# Patient Record
Sex: Male | Born: 1985 | Race: Black or African American | Hispanic: No | Marital: Married | Smoking: Never smoker
Health system: Southern US, Community
[De-identification: ages and names within clinical notes are randomized; demographics above are authoritative.]

---

## 2018-06-19 ENCOUNTER — Encounter (HOSPITAL_COMMUNITY): Payer: Self-pay | Admitting: *Deleted

## 2018-06-19 ENCOUNTER — Ambulatory Visit (HOSPITAL_COMMUNITY)
Admission: EM | Admit: 2018-06-19 | Discharge: 2018-06-19 | Disposition: A | Discharge status: Home / Self Care | Payer: Medicaid Other | Attending: Internal Medicine | Admitting: Internal Medicine

## 2018-06-19 ENCOUNTER — Other Ambulatory Visit: Payer: Self-pay

## 2018-06-19 DIAGNOSIS — K5289 Other specified noninfective gastroenteritis and colitis: Secondary | ICD-10-CM

## 2018-06-19 DIAGNOSIS — K529 Noninfective gastroenteritis and colitis, unspecified: Secondary | ICD-10-CM | POA: Insufficient documentation

## 2018-06-19 DIAGNOSIS — R197 Diarrhea, unspecified: Secondary | ICD-10-CM | POA: Diagnosis present

## 2018-06-19 DIAGNOSIS — R109 Unspecified abdominal pain: Secondary | ICD-10-CM | POA: Diagnosis present

## 2018-06-19 MED ORDER — BISMUTH SUBSALICYLATE 262 MG/15ML PO SUSP: 30.0000 mL | Freq: Four times a day (QID) | ORAL | 0 refills | Status: DC | PRN

## 2018-06-19 NOTE — Discharge Instructions (Signed)
Please use pepto bismol as needed  I expect bowel movements to continue to slowly improve and consistency and frequency  Please drink plenty of fluids, may drink Pedialyte or Gatorade to help replace electrolytes  Please follow-up if symptoms continue to not improve, worsen, abdominal pain worsens, develops nausea or vomiting

## 2018-06-19 NOTE — ED Provider Notes (Signed)
[facility]    CSN: [id number] Arrival date & time: 06/19/18  1050     History   Chief Complaint No chief complaint on file.   HPI Phillip Morgan is a 32 y.o. male no significant past medical history presenting today for evaluation of abdominal pain and diarrhea. Symptoms began approximately 1 week ago and have persisted. He recently traveled from [place] to the [place] yesterday. Abdominal pain is described as cramping.  Cramping comes and goes, worse right before needing to go to a bowel movement.  Pain slightly improves after having a bowel movement.  States that symptoms have improved since he was in [place] and symptoms initially began.  He has noticed a little bit of blood in the stool and pain near his rectum.  Denies associated nausea or vomiting.  Tolerating oral intake like normal.  Has felt slightly lightheaded.  HPI  History reviewed. No pertinent past medical history.  There are no active problems to display for this patient.   History reviewed. No pertinent surgical history.     Home Medications    Prior to Admission medications   Medication Sig Start Date End Date Taking? Authorizing Provider  bismuth subsalicylate (PEPTO BISMOL) 262 MG/15ML suspension Take 30 mLs by mouth every 6 (six) hours as needed for diarrhea or loose stools. 06/19/18   Josilyn Shippee, Junius Creamer, PA-C    Family History Family History  Problem Relation Age of Onset  . Healthy Mother     Social History Social History   Tobacco Use  . Smoking status: Never Smoker  . Smokeless tobacco: Never Used  Substance Use Topics  . Alcohol use: Never    Frequency: Never  . Drug use: Never     Allergies   Patient has no known allergies.   Review of Systems Review of Systems  Constitutional: Negative for activity change, appetite change, chills, fatigue and fever.  HENT: Negative for congestion, ear pain, rhinorrhea, sinus pressure, sore throat and trouble swallowing.   Eyes:  Negative for discharge and redness.  Respiratory: Negative for cough, chest tightness and shortness of breath.   Cardiovascular: Negative for chest pain.  Gastrointestinal: Positive for abdominal pain, blood in stool and diarrhea. Negative for nausea and vomiting.  Musculoskeletal: Negative for myalgias.  Skin: Negative for rash.  Neurological: Negative for dizziness, light-headedness and headaches.     Physical Exam Triage Vital Signs ED Triage Vitals  Enc Vitals Group     BP 06/19/18 1109 (!) 104/50     Pulse Rate 06/19/18 1109 (!) 57     Resp 06/19/18 1109 16     Temp 06/19/18 1109 98.4 F (36.9 C)     Temp Source 06/19/18 1109 Oral     SpO2 06/19/18 1109 100 %     Weight --      Height --      Head Circumference --      Peak Flow --      Pain Score 06/19/18 1115 0     Pain Loc --      Pain Edu? --      Excl. in GC? --    No data found.  Updated Vital Signs BP (!) 104/50   Pulse (!) 57   Temp 98.4 F (36.9 C) (Oral)   Resp 16   SpO2 100%   Visual Acuity Right Eye Distance:   Left Eye Distance:   Bilateral Distance:    Right Eye Near:   Left Eye Near:  Bilateral Near:     Physical Exam  Constitutional: He appears well-developed and well-nourished.  HENT:  Head: Normocephalic and atraumatic.  Mouth/Throat: Oropharynx is clear and moist.  Eyes: Pupils are equal, round, and reactive to light. Conjunctivae and EOM are normal.  Neck: Neck supple.  Cardiovascular: Normal rate and regular rhythm.  No murmur heard. Pulmonary/Chest: Effort normal and breath sounds normal. No respiratory distress.  Abdominal: Soft. There is tenderness.  Mild tenderness to palpation of right and left lower quadrants, negative rebound, negative McBurney's, negative Rovsing.  Musculoskeletal: He exhibits no edema.  Neurological: He is alert.  Skin: Skin is warm and dry.  Psychiatric: He has a normal mood and affect.  Nursing note and vitals reviewed.    UC Treatments /  Results  Labs (all labs ordered are listed, but only abnormal results are displayed) Labs Reviewed  GASTROINTESTINAL PANEL BY PCR, STOOL (REPLACES STOOL CULTURE)    EKG None  Radiology No results found.  Procedures Procedures (including critical care time)  Medications Ordered in UC Medications - No data to display  Initial Impression / Assessment and Plan / UC Course  I have reviewed the triage vital signs and the nursing notes.  Pertinent labs & imaging results that were available during my care of the patient were reviewed by me and considered in my medical decision making (see chart for details).     Stool sample attempted to obtain in clinic, insufficient amount.  Will send home with supplies to return stool sample for further GI pathogen testing.  Symptoms seem to be improving, tolerating oral intake.  Discussed importance of hydration.  Provided Pepto-Bismol to use as needed.Discussed strict return precautions. Patient verbalized understanding and is agreeable with plan.  Final Clinical Impressions(s) / UC Diagnoses   Final diagnoses:  Gastroenteritis     Discharge Instructions     Please use pepto bismol as needed  I expect bowel movements to continue to slowly improve and consistency and frequency  Please drink plenty of fluids, may drink Pedialyte or Gatorade to help replace electrolytes  Please follow-up if symptoms continue to not improve, worsen, abdominal pain worsens, develops nausea or vomiting    ED Prescriptions    Medication Sig Dispense Auth. Provider   bismuth subsalicylate (PEPTO BISMOL) 262 MG/15ML suspension Take 30 mLs by mouth every 6 (six) hours as needed for diarrhea or loose stools. 360 mL Raymonda Pell C, PA-C     Controlled Substance Prescriptions  Controlled Substance Registry consulted? Not Applicable   Lew DawesWieters, Shenay Torti C, [place]PA-C 06/19/18 1240

## 2018-06-19 NOTE — ED Triage Notes (Signed)
Per video interpreter [phone]#[id number]: C/O abd pain with diarrhea x 7 days, while pt was in [place].  States was treated with med (unk type of med) in [place] and was having improvement, but when coming to [place], cramping pain started again.  Reports "a little bit of slight blood" in stool; states blood bright red.

## 2018-06-21 LAB — GASTROINTESTINAL PANEL BY PCR, STOOL (REPLACES STOOL CULTURE)
ADENOVIRUS F40/41: NOT DETECTED
Astrovirus: NOT DETECTED
CYCLOSPORA CAYETANENSIS: NOT DETECTED
Campylobacter species: NOT DETECTED
Cryptosporidium: NOT DETECTED
ENTAMOEBA HISTOLYTICA: NOT DETECTED
ENTEROAGGREGATIVE E COLI (EAEC): DETECTED — AB
ENTEROPATHOGENIC E COLI (EPEC): NOT DETECTED
Enterotoxigenic E coli (ETEC): NOT DETECTED
GIARDIA LAMBLIA: NOT DETECTED
Norovirus GI/GII: NOT DETECTED
Plesimonas shigelloides: NOT DETECTED
ROTAVIRUS A: NOT DETECTED
Salmonella species: NOT DETECTED
Sapovirus (I, II, IV, and V): DETECTED — AB
Shiga like toxin producing E coli (STEC): NOT DETECTED
Shigella/Enteroinvasive E coli (EIEC): DETECTED — AB
VIBRIO CHOLERAE: NOT DETECTED
VIBRIO SPECIES: NOT DETECTED
YERSINIA ENTEROCOLITICA: NOT DETECTED

## 2018-06-22 ENCOUNTER — Telehealth (HOSPITAL_COMMUNITY): Payer: Self-pay

## 2018-06-22 NOTE — Telephone Encounter (Signed)
Per Dr. Tracie HarrierHagler, if patient is asymptomatic no treatment is needed. Per patient he is feeling better with no diarrhea or vomiting. Encouraged to follow up with his PCP. Verbalized understanding.

## 2018-10-08 ENCOUNTER — Ambulatory Visit: Payer: Self-pay | Admitting: Family Medicine

## 2018-10-14 ENCOUNTER — Encounter: Payer: Self-pay | Admitting: Family Medicine

## 2018-10-14 ENCOUNTER — Ambulatory Visit (INDEPENDENT_AMBULATORY_CARE_PROVIDER_SITE_OTHER): Payer: Medicaid Other | Admitting: Family Medicine

## 2018-10-14 VITALS — BP 118/71 | HR 58 | Temp 98.0°F | Resp 17 | Ht 65.0 in | Wt 138.2 lb

## 2018-10-14 DIAGNOSIS — Z1329 Encounter for screening for other suspected endocrine disorder: Secondary | ICD-10-CM | POA: Diagnosis not present

## 2018-10-14 DIAGNOSIS — Z7689 Persons encountering health services in other specified circumstances: Secondary | ICD-10-CM

## 2018-10-14 DIAGNOSIS — Z131 Encounter for screening for diabetes mellitus: Secondary | ICD-10-CM

## 2018-10-14 DIAGNOSIS — Z23 Encounter for immunization: Secondary | ICD-10-CM

## 2018-10-14 DIAGNOSIS — Z13220 Encounter for screening for lipoid disorders: Secondary | ICD-10-CM

## 2018-10-14 DIAGNOSIS — Z130 Encounter for screening for diseases of the blood and blood-forming organs and certain disorders involving the immune mechanism: Secondary | ICD-10-CM | POA: Diagnosis not present

## 2018-10-14 NOTE — Progress Notes (Signed)
Phillip Morgan, is a 32 y.o. male  HKV:[id number]  FIE:[id number]  DOB - [birth year]  CC: No chief complaint on file.      HPI: [name] is a 32 y.o. male is here today to establish care.   [name] does not have a problem list on file.    Today's visit:  Phillip Morgan present today to establish care with a new provider. He has not prior medical history . He is a recent immigrant from [place]. Reports adjusting well. He works at [employer]. He has not issues or concerns. Received  Patient denies new headaches, chest pain, abdominal pain, nausea, new weakness , numbness or tingling, SOB, edema, or worrisome cough. .   Current medications:No current outpatient medications on file.   Pertinent family medical history: family history includes Healthy in his mother.   No Known Allergies  Social History   Socioeconomic History  . Marital status: Married    Spouse name: Not on file  . Number of children: Not on file  . Years of education: Not on file  . Highest education level: Not on file  Occupational History  . Not on file  Social Needs  . Financial resource strain: Not on file  . Food insecurity:    Worry: Not on file    Inability: Not on file  . Transportation needs:    Medical: Not on file    Non-medical: Not on file  Tobacco Use  . Smoking status: Never Smoker  . Smokeless tobacco: Never Used  Substance and Sexual Activity  . Alcohol use: Never    Frequency: Never  . Drug use: Never  . Sexual activity: Not on file  Lifestyle  . Physical activity:    Days per week: Not on file    Minutes per session: Not on file  . Stress: Not on file  Relationships  . Social connections:    Talks on phone: Not on file    Gets together: Not on file    Attends religious service: Not on file    Active member of club or organization: Not on file    Attends meetings of clubs or organizations: Not on file    Relationship status: Not on file  . Intimate partner  violence:    Fear of current or ex partner: Not on file    Emotionally abused: Not on file    Physically abused: Not on file    Forced sexual activity: Not on file  Other Topics Concern  . Not on file  Social History Narrative  . Not on file    Review of Systems: Constitutional: Negative for fever, chills, diaphoresis, activity change, appetite change and fatigue. Eyes: Negative for pain, discharge, redness, itching and visual disturbance. Respiratory: Negative for cough, choking, chest tightness, shortness of breath, wheezing and stridor.  Cardiovascular: Negative for chest pain, palpitations and leg swelling. Gastrointestinal: Negative for abdominal distention, tenderness, or mass Musculoskeletal: Negative for back pain, joint swelling, arthralgia and gait problem. Neurological: Negative for dizziness, tremors, seizures, syncope, facial asymmetry, speech difficulty, weakness, light-headedness, numbness and headaches.  Hematological: Negative for adenopathy. Does not bruise/bleed easily. Psychiatric/Behavioral: Negative for hallucinations, behavioral problems, confusion, dysphoric mood, decreased concentration and agitation.  Objective:   Vitals:   10/14/18 1107  BP: 118/71  Pulse: (!) 58  Resp: 17  Temp: 98 F (36.7 C)  SpO2: 98%    BP Readings from Last 3 Encounters:  06/19/18 (!) 104/50    [employer]  10/14/18 1107  Weight: 138 lb 3.2 oz (62.7 kg)      Physical Exam: Constitutional: Patient appears well-developed and well-nourished. No distress. HENT: Normocephalic, atraumatic, External right and left ear normal. Oropharynx is clear and moist.  Eyes: Conjunctivae and EOM are normal. PERRLA, no scleral icterus. Neck: Normal ROM. Neck supple. No JVD. No tracheal deviation. No thyromegaly. CVS: RR, Bradycardia, S1/S2 +, no murmurs, no gallops, no carotid bruit.  Pulmonary: Effort and breath sounds normal, no stridor, rhonchi, wheezes, rales.  Abdominal: Soft. BS  +, no distension, tenderness, rebound or guarding.  Musculoskeletal: Normal range of motion. No edema and no tenderness.  Neuro: Alert. Normal muscle tone coordination. Normal gait. BUE and BLE strength 5/5. Bilateral hand grips symmetrical. No cranial nerve deficit. Skin: Skin is warm and dry. No rash noted. Not diaphoretic. No erythema. No pallor. Psychiatric: Normal mood and affect. Behavior, judgment, thought content normal.     Assessment and plan:  1. Encounter to establish care  2. Screening, lipid - Lipid panel; Standing  3. Screening for thyroid disorder - TSH; Standing  4. Screening for diabetes mellitus - Comprehensive metabolic panel; Standing - Hemoglobin A1c; Standing  5. Screening for deficiency anemia - CBC with Differential; Standing  6. Need for immunization against influenza - Flu Vaccine QUAD 36+ mos IM   Orders Placed This Encounter  Procedures  . Flu Vaccine QUAD 36+ mos IM  . Lipid panel    Standing Status:   Standing    Number of Occurrences:   2    Standing Expiration Date:   10/15/2019    Order Specific Question:   Has the patient fasted?    Answer:   No  . Comprehensive metabolic panel    Standing Status:   Standing    Number of Occurrences:   2    Standing Expiration Date:   10/15/2019    Order Specific Question:   Has the patient fasted?    Answer:   No  . CBC with Differential    Standing Status:   Standing    Number of Occurrences:   3    Standing Expiration Date:   10/15/2019  . Hemoglobin A1c    Standing Status:   Standing    Number of Occurrences:   3    Standing Expiration Date:   10/15/2019  . TSH    Standing Status:   Standing    Number of Occurrences:   3    Standing Expiration Date:   10/15/2019     A total of 30 minutes spent, greater than 50 % of this time was spent counseling, use of interpreter,  and coordination of care.  Return for return within 4 weeks for fasting labs.   The patient was given clear instructions to  go to ER or return to medical center if symptoms don't improve, worsen or new problems develop. The patient verbalized understanding. The patient was advised  to call and obtain lab results if they haven't heard anything from out office within 7-10 business days.  Joaquin Courts, FNP [facility] [address], [place] [address] [phone]: [phone]    This note has been created with Dragon speech recognition software and Paediatric nurse. Any transcriptional errors are unintentional.

## 2018-10-14 NOTE — Patient Instructions (Addendum)
Thank you for choosing [facility] at [facility] to be your medical home!    Phillip Morgan was seen by Joaquin Courts, FNP today.   Phillip Morgan's primary care provider is Bing Neighbors, FNP.   For the best care possible, you should try to see Joaquin Courts, FNP-C whenever you come to the clinic.   We look forward to seeing you again soon!  If you have any questions about your visit today, please call us at [phone] or feel free to reach your primary care provider via MyChart.     Health Maintenance, Male A healthy lifestyle and preventive care is important for your health and wellness. Ask your health care provider about what schedule of regular examinations is right for you. What should I know about weight and diet? Eat a Healthy Diet  Eat plenty of vegetables, fruits, whole grains, low-fat dairy products, and lean protein.  Do not eat a lot of foods high in solid fats, added sugars, or salt.  Maintain a Healthy Weight Regular exercise can help you achieve or maintain a healthy weight. You should:  Do at least 150 minutes of exercise each week. The exercise should increase your heart rate and make you sweat (moderate-intensity exercise).  Do strength-training exercises at least twice a week.  Watch Your Levels of Cholesterol and Blood Lipids  Have your blood tested for lipids and cholesterol every 5 years starting at 33 years of age. If you are at high risk for heart disease, you should start having your blood tested when you are 32 years old. You may need to have your cholesterol levels checked more often if: ? Your lipid or cholesterol levels are high. ? You are older than 32 years of age. ? You are at high risk for heart disease.  What should I know about cancer screening? Many types of cancers can be detected early and may often be prevented. Lung Cancer  You should be screened every year for lung cancer if: ? You are a current smoker who has  smoked for at least 30 years. ? You are a former smoker who has quit within the past 15 years.  Talk to your health care provider about your screening options, when you should start screening, and how often you should be screened.  Colorectal Cancer  Routine colorectal cancer screening usually begins at 32 years of age and should be repeated every 5-10 years until you are 32 years old. You may need to be screened more often if early forms of precancerous polyps or small growths are found. Your health care provider may recommend screening at an earlier age if you have risk factors for colon cancer.  Your health care provider may recommend using home test kits to check for hidden blood in the stool.  A small camera at the end of a tube can be used to examine your colon (sigmoidoscopy or colonoscopy). This checks for the earliest forms of colorectal cancer.  Prostate and Testicular Cancer  Depending on your age and overall health, your health care provider may do certain tests to screen for prostate and testicular cancer.  Talk to your health care provider about any symptoms or concerns you have about testicular or prostate cancer.  Skin Cancer  Check your skin from head to toe regularly.  Tell your health care provider about any new moles or changes in moles, especially if: ? There is a change in a mole's size, shape, or color. ? You  have a mole that is larger than a pencil eraser.  Always use sunscreen. Apply sunscreen liberally and repeat throughout the day.  Protect yourself by wearing long sleeves, pants, a wide-brimmed hat, and sunglasses when outside.  What should I know about heart disease, diabetes, and high blood pressure?  If you are 8-57 years of age, have your blood pressure checked every 3-5 years. If you are 74 years of age or older, have your blood pressure checked every year. You should have your blood pressure measured twice-once when you are at a hospital or clinic,  and once when you are not at a hospital or clinic. Record the average of the two measurements. To check your blood pressure when you are not at a hospital or clinic, you can use: ? An automated blood pressure machine at a pharmacy. ? A home blood pressure monitor.  Talk to your health care provider about your target blood pressure.  If you are between 70-26 years old, ask your health care provider if you should take aspirin to prevent heart disease.  Have regular diabetes screenings by checking your fasting blood sugar level. ? If you are at a normal weight and have a low risk for diabetes, have this test once every three years after the age of 67. ? If you are overweight and have a high risk for diabetes, consider being tested at a younger age or more often.  A one-time screening for abdominal aortic aneurysm (AAA) by ultrasound is recommended for men aged 65-75 years who are current or former smokers. What should I know about preventing infection? Hepatitis B If you have a higher risk for hepatitis B, you should be screened for this virus. Talk with your health care provider to find out if you are at risk for hepatitis B infection. Hepatitis C Blood testing is recommended for:  Everyone born from [birth year] through 1965.  Anyone with known risk factors for hepatitis C.  Sexually Transmitted Diseases (STDs)  You should be screened each year for STDs including gonorrhea and chlamydia if: ? You are sexually active and are younger than 32 years of age. ? You are older than 32 years of age and your health care provider tells you that you are at risk for this type of infection. ? Your sexual activity has changed since you were last screened and you are at an increased risk for chlamydia or gonorrhea. Ask your health care provider if you are at risk.  Talk with your health care provider about whether you are at high risk of being infected with HIV. Your health care provider may recommend a  prescription medicine to help prevent HIV infection.  What else can I do?  Schedule regular health, dental, and eye exams.  Stay current with your vaccines (immunizations).  Do not use any tobacco products, such as cigarettes, chewing tobacco, and e-cigarettes. If you need help quitting, ask your health care provider.  Limit alcohol intake to no more than 2 drinks per day. One drink equals 12 ounces of beer, 5 ounces of wine, or 1 ounces of hard liquor.  Do not use street drugs.  Do not share needles.  Ask your health care provider for help if you need support or information about quitting drugs.  Tell your health care provider if you often feel depressed.  Tell your health care provider if you have ever been abused or do not feel safe at home. This information is not intended to replace advice given to  you by your health care provider. Make sure you discuss any questions you have with your health care provider. Document Released: 05/22/2008 Document Revised: 07/23/2016 Document Reviewed: 08/28/2015 Elsevier Interactive Patient Education  [employer].

## 2018-10-19 ENCOUNTER — Other Ambulatory Visit: Payer: Medicaid Other

## 2019-01-08 ENCOUNTER — Encounter (HOSPITAL_COMMUNITY): Payer: Self-pay | Admitting: Emergency Medicine

## 2019-01-08 ENCOUNTER — Ambulatory Visit (INDEPENDENT_AMBULATORY_CARE_PROVIDER_SITE_OTHER): Payer: Medicaid Other

## 2019-01-08 ENCOUNTER — Ambulatory Visit (HOSPITAL_COMMUNITY)
Admission: EM | Admit: 2019-01-08 | Discharge: 2019-01-08 | Disposition: A | Discharge status: Home / Self Care | Payer: Medicaid Other | Attending: Family Medicine | Admitting: Family Medicine

## 2019-01-08 DIAGNOSIS — M79602 Pain in left arm: Secondary | ICD-10-CM | POA: Diagnosis not present

## 2019-01-08 DIAGNOSIS — S59912A Unspecified injury of left forearm, initial encounter: Secondary | ICD-10-CM | POA: Diagnosis not present

## 2019-01-08 DIAGNOSIS — S5002XA Contusion of left elbow, initial encounter: Secondary | ICD-10-CM | POA: Diagnosis not present

## 2019-01-08 DIAGNOSIS — S0101XA Laceration without foreign body of scalp, initial encounter: Secondary | ICD-10-CM | POA: Diagnosis not present

## 2019-01-08 DIAGNOSIS — S0180XA Unspecified open wound of other part of head, initial encounter: Secondary | ICD-10-CM | POA: Diagnosis not present

## 2019-01-08 DIAGNOSIS — H1032 Unspecified acute conjunctivitis, left eye: Secondary | ICD-10-CM | POA: Diagnosis not present

## 2019-01-08 DIAGNOSIS — Z23 Encounter for immunization: Secondary | ICD-10-CM | POA: Diagnosis not present

## 2019-01-08 DIAGNOSIS — S5012XA Contusion of left forearm, initial encounter: Secondary | ICD-10-CM | POA: Diagnosis not present

## 2019-01-08 MED ORDER — DICLOFENAC SODIUM 75 MG PO TBEC: 75.0000 mg | DELAYED_RELEASE_TABLET | Freq: Two times a day (BID) | ORAL | 0 refills | Status: AC

## 2019-01-08 MED ORDER — TOBRAMYCIN 0.3 % OP SOLN: 1.0000 [drp] | Freq: Four times a day (QID) | OPHTHALMIC | 0 refills | Status: AC

## 2019-01-08 NOTE — ED Triage Notes (Signed)
Pt c/o headache just today.
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
## 2019-01-23 ENCOUNTER — Emergency Department (HOSPITAL_COMMUNITY)
Admission: EM | Admit: 2019-01-23 | Discharge: 2019-01-23 | Disposition: A | Discharge status: Home / Self Care | Payer: Medicaid Other | Attending: Emergency Medicine | Admitting: Emergency Medicine

## 2019-01-23 ENCOUNTER — Encounter (HOSPITAL_COMMUNITY): Payer: Self-pay | Admitting: Emergency Medicine

## 2019-01-23 DIAGNOSIS — S0101XD Laceration without foreign body of scalp, subsequent encounter: Secondary | ICD-10-CM | POA: Diagnosis not present

## 2019-01-23 DIAGNOSIS — Z4802 Encounter for removal of sutures: Secondary | ICD-10-CM | POA: Diagnosis not present

## 2019-01-23 DIAGNOSIS — Y33XXXD Other specified events, undetermined intent, subsequent encounter: Secondary | ICD-10-CM | POA: Insufficient documentation
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
## 2019-01-23 NOTE — Discharge Instructions (Addendum)
Your wound appears to be healing well. Continue to keep the area clean and dry.   May apply ointments or lotions such as Aquaphor to the area to reduce scarring. The key is to keep the skin well hydrated and supple. It is also important to stay well hydrated by drinking plenty of water. Keep your scar protected from the sun. Cover the scar with sunscreen that has an SPF (sun protection factor) of 30 or higher. Do not put sunscreen on your scar until it has healed. Gently massage the scar using a circular motion. This will help to minimize the appearance of the scar. Do this only after the incision has closed and all of the sutures have been removed. Remember that the scar may appear lighter or darker than your normal skin color. This difference in color should even out with time. If your scar does not fade or go away with time and you do not like how it looks, consider talking with a plastic surgeon or a dermatologist.  

## 2020-06-07 DIAGNOSIS — Z419 Encounter for procedure for purposes other than remedying health state, unspecified: Secondary | ICD-10-CM | POA: Diagnosis not present
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
## 2020-07-08 DIAGNOSIS — Z419 Encounter for procedure for purposes other than remedying health state, unspecified: Secondary | ICD-10-CM | POA: Diagnosis not present

## 2020-08-08 DIAGNOSIS — Z419 Encounter for procedure for purposes other than remedying health state, unspecified: Secondary | ICD-10-CM | POA: Diagnosis not present

## 2020-09-07 DIAGNOSIS — Z419 Encounter for procedure for purposes other than remedying health state, unspecified: Secondary | ICD-10-CM | POA: Diagnosis not present

## 2020-10-08 DIAGNOSIS — Z419 Encounter for procedure for purposes other than remedying health state, unspecified: Secondary | ICD-10-CM | POA: Diagnosis not present

## 2020-11-07 DIAGNOSIS — Z419 Encounter for procedure for purposes other than remedying health state, unspecified: Secondary | ICD-10-CM | POA: Diagnosis not present

## 2020-12-08 DIAGNOSIS — Z419 Encounter for procedure for purposes other than remedying health state, unspecified: Secondary | ICD-10-CM | POA: Diagnosis not present

## 2021-01-08 DIAGNOSIS — Z419 Encounter for procedure for purposes other than remedying health state, unspecified: Secondary | ICD-10-CM | POA: Diagnosis not present

## 2021-02-05 DIAGNOSIS — Z419 Encounter for procedure for purposes other than remedying health state, unspecified: Secondary | ICD-10-CM | POA: Diagnosis not present

## 2021-03-08 DIAGNOSIS — Z419 Encounter for procedure for purposes other than remedying health state, unspecified: Secondary | ICD-10-CM | POA: Diagnosis not present

## 2021-04-07 DIAGNOSIS — Z419 Encounter for procedure for purposes other than remedying health state, unspecified: Secondary | ICD-10-CM | POA: Diagnosis not present

## 2021-05-08 DIAGNOSIS — Z419 Encounter for procedure for purposes other than remedying health state, unspecified: Secondary | ICD-10-CM | POA: Diagnosis not present

## 2021-06-07 DIAGNOSIS — Z419 Encounter for procedure for purposes other than remedying health state, unspecified: Secondary | ICD-10-CM | POA: Diagnosis not present

## 2021-07-08 DIAGNOSIS — Z419 Encounter for procedure for purposes other than remedying health state, unspecified: Secondary | ICD-10-CM | POA: Diagnosis not present

## 2021-08-08 DIAGNOSIS — Z419 Encounter for procedure for purposes other than remedying health state, unspecified: Secondary | ICD-10-CM | POA: Diagnosis not present

## 2021-09-07 DIAGNOSIS — Z419 Encounter for procedure for purposes other than remedying health state, unspecified: Secondary | ICD-10-CM | POA: Diagnosis not present

## 2021-10-08 DIAGNOSIS — Z419 Encounter for procedure for purposes other than remedying health state, unspecified: Secondary | ICD-10-CM | POA: Diagnosis not present

## 2021-11-07 DIAGNOSIS — Z419 Encounter for procedure for purposes other than remedying health state, unspecified: Secondary | ICD-10-CM | POA: Diagnosis not present

## 2021-12-08 DIAGNOSIS — Z419 Encounter for procedure for purposes other than remedying health state, unspecified: Secondary | ICD-10-CM | POA: Diagnosis not present

## 2022-01-08 DIAGNOSIS — Z419 Encounter for procedure for purposes other than remedying health state, unspecified: Secondary | ICD-10-CM | POA: Diagnosis not present

## 2022-02-05 DIAGNOSIS — Z419 Encounter for procedure for purposes other than remedying health state, unspecified: Secondary | ICD-10-CM | POA: Diagnosis not present
